# Patient Record
Sex: Female | Born: 1985 | Race: White | Hispanic: No | Marital: Single | Smoking: Never smoker
Health system: Southern US, Community
[De-identification: ages and names within clinical notes are randomized; demographics above are authoritative.]

---

## 2017-05-28 ENCOUNTER — Ambulatory Visit (INDEPENDENT_AMBULATORY_CARE_PROVIDER_SITE_OTHER): Payer: BLUE CROSS/BLUE SHIELD

## 2017-05-28 ENCOUNTER — Encounter: Payer: Self-pay | Admitting: Family

## 2017-05-28 ENCOUNTER — Ambulatory Visit (INDEPENDENT_AMBULATORY_CARE_PROVIDER_SITE_OTHER): Payer: BLUE CROSS/BLUE SHIELD | Admitting: Family

## 2017-05-28 VITALS — BP 100/60 | HR 67 | Temp 98.6°F | Ht 62.0 in | Wt 186.6 lb

## 2017-05-28 DIAGNOSIS — Z30431 Encounter for routine checking of intrauterine contraceptive device: Secondary | ICD-10-CM

## 2017-05-28 DIAGNOSIS — Z0000 Encounter for general adult medical examination without abnormal findings: Secondary | ICD-10-CM | POA: Diagnosis not present

## 2017-05-28 DIAGNOSIS — N898 Other specified noninflammatory disorders of vagina: Secondary | ICD-10-CM

## 2017-05-28 DIAGNOSIS — Z538 Procedure and treatment not carried out for other reasons: Secondary | ICD-10-CM

## 2017-05-28 DIAGNOSIS — E669 Obesity, unspecified: Secondary | ICD-10-CM | POA: Insufficient documentation

## 2017-05-28 DIAGNOSIS — Z01419 Encounter for gynecological examination (general) (routine) without abnormal findings: Secondary | ICD-10-CM

## 2017-05-28 DIAGNOSIS — Z713 Dietary counseling and surveillance: Secondary | ICD-10-CM

## 2017-05-28 DIAGNOSIS — Z975 Presence of (intrauterine) contraceptive device: Secondary | ICD-10-CM

## 2017-05-28 LAB — URINALYSIS, COMPLETE
BILIRUBIN UA: NEGATIVE
GLUCOSE, UA: NEGATIVE
NITRITE UA: NEGATIVE
PH UA: 6 (ref 5.0–7.5)
Specific Gravity, UA: 1.025 (ref 1.005–1.030)
UUROB: 1 mg/dL (ref 0.2–1.0)

## 2017-05-28 LAB — WET PREP FOR TRICH, YEAST, CLUE
Clue Cell Exam: NEGATIVE
TRICHOMONAS EXAM: NEGATIVE
YEAST EXAM: POSITIVE — AB

## 2017-05-28 LAB — MICROSCOPIC EXAMINATION: RENAL EPITHEL UA: NONE SEEN /HPF

## 2017-05-28 LAB — PREGNANCY, URINE: PREG TEST UR: NEGATIVE

## 2017-05-28 MED ORDER — PHENTERMINE HCL 37.5 MG PO TABS: 37.5000 mg | ORAL_TABLET | Freq: Every day | ORAL | 2 refills | Status: AC

## 2017-05-28 NOTE — Progress Notes (Signed)
Subjective:    Patient ID: Margaret Snyder, female    DOB: May 06, 1986, 31 y.o.   MRN: [id number]  Pt presents to the office today to establish care and CPE and pap. PT has Mirena 12/14. PT requesting IUD be removed today. PT states she has gained 20 lbs since IUD. PT has three children and wants to be on OC.  Gynecologic Exam  The patient's primary symptoms include a genital odor and vaginal bleeding (last month, pt has mirena). The patient's pertinent negatives include no genital itching or vaginal discharge. Pertinent negatives include no constipation, diarrhea, flank pain or painful intercourse. She is sexually active.      Review of Systems  Gastrointestinal: Negative for constipation and diarrhea.  Genitourinary: Negative for flank pain and vaginal discharge.  All other systems reviewed and are negative.  Family History  Problem Relation Age of Onset  . Diabetes Father    Social History   Social History  . Marital status: Single    Spouse name: N/A  . Number of children: N/A  . Years of education: N/A   Social History Main Topics  . Smoking status: Never Smoker  . Smokeless tobacco: Never Used  . Alcohol use 1.2 oz/week    2 Glasses of wine per week  . Drug use: No  . Sexual activity: Yes   Other Topics Concern  . None   Social History Narrative  . None         Objective:   Physical Exam  Constitutional: She is oriented to person, place, and time. She appears well-developed and well-nourished. No distress.  HENT:  Head: Normocephalic and atraumatic.  Right Ear: External ear normal.  Left Ear: External ear normal.  Nose: Nose normal.  Mouth/Throat: Oropharynx is clear and moist.  Eyes: Pupils are equal, round, and reactive to light.  Neck: Normal range of motion. Neck supple. No thyromegaly present.  Cardiovascular: Normal rate, regular rhythm, normal heart sounds and intact distal pulses.   No murmur heard. Pulmonary/Chest: Effort normal and breath sounds  normal. No respiratory distress. She has no wheezes. Right breast exhibits no inverted nipple, no mass, no nipple discharge, no skin change and no tenderness. Left breast exhibits no inverted nipple, no mass, no nipple discharge, no skin change and no tenderness. Breasts are symmetrical.  Abdominal: Soft. Bowel sounds are normal. She exhibits no distension. There is no tenderness.  Genitourinary: Vagina normal.  Genitourinary Comments: Bimanual exam- no adnexal masses or tenderness, ovaries nonpalpable   Cervix parous and pink- No discharge, IUD not seen   Musculoskeletal: Normal range of motion. She exhibits no edema or tenderness.  Neurological: She is alert and oriented to person, place, and time. She has normal reflexes. No cranial nerve deficit.  Skin: Skin is warm and dry.  Psychiatric: She has a normal mood and affect. Her behavior is normal. Judgment and thought content normal.  Vitals reviewed.  KUB- IUD seen but inverted   Attempted several times to remove IUD, unsuccessful, pt tolerated well   BP 100/60   Pulse 67   Temp 98.6 F (37 C) (Oral)   Ht 5' 2" (1.575 m)   Wt 186 lb 9.6 oz (84.6 kg)   BMI 34.13 kg/m      Assessment & Plan:  1. Normal gynecologic examination - Urinalysis, Complete - CMP14+EGFR - CBC with Differential/Platelet - Pap IG w/ reflex to HPV when ASC-U  2. Annual physical exam - CMP14+EGFR - CBC with Differential/Platelet - Lipid panel -  Thyroid Panel With TSH - VITAMIN D 25 Hydroxy (Vit-D Deficiency, Fractures) - Pap IG w/ reflex to HPV when ASC-U  3. Vaginal discharge - CMP14+EGFR - CBC with Differential/Platelet - WET PREP FOR TRICH, YEAST, CLUE  4. IUD check up - DG Abd 1 View; Future - Pregnancy, urine  5. Obesity (BMI 30.0-34.9) Pt started on Phentermine today Encourage exercise and diet RTO in 3 months to recheck - Must lose 5% of weight lose to continue - phentermine (ADIPEX-P) 37.5 MG tablet; Take 1 tablet (37.5 mg total)  by mouth daily before breakfast.  Dispense: 30 tablet; Refill: 2  6. Weight loss counseling, encounter for - phentermine (ADIPEX-P) 37.5 MG tablet; Take 1 tablet (37.5 mg total) by mouth daily before breakfast.  Dispense: 30 tablet; Refill: 2  7. Unsuccessful attempt to remove intrauterine device (IUD) -Will send to [facility] - Ambulatory referral to Obstetrics / Gynecology   Continue all meds Labs pending Health Maintenance reviewed Diet and exercise encouraged RTO 3 months to recheck weight loss  Evelina Dun, FNP

## 2017-05-28 NOTE — Patient Instructions (Signed)

## 2017-05-29 LAB — CBC WITH DIFFERENTIAL/PLATELET
BASOS ABS: 0 10*3/uL (ref 0.0–0.2)
Basos: 0 %
EOS (ABSOLUTE): 0 10*3/uL (ref 0.0–0.4)
EOS: 1 %
HEMATOCRIT: 38.2 % (ref 34.0–46.6)
HEMOGLOBIN: 12.6 g/dL (ref 11.1–15.9)
IMMATURE GRANS (ABS): 0 10*3/uL (ref 0.0–0.1)
IMMATURE GRANULOCYTES: 0 %
LYMPHS ABS: 1.8 10*3/uL (ref 0.7–3.1)
LYMPHS: 28 %
MCH: 28.6 pg (ref 26.6–33.0)
MCHC: 33 g/dL (ref 31.5–35.7)
MCV: 87 fL (ref 79–97)
MONOCYTES: 5 %
Monocytes Absolute: 0.4 10*3/uL (ref 0.1–0.9)
NEUTROS PCT: 66 %
Neutrophils Absolute: 4.4 10*3/uL (ref 1.4–7.0)
Platelets: 287 10*3/uL (ref 150–379)
RBC: 4.41 x10E6/uL (ref 3.77–5.28)
RDW: 12.7 % (ref 12.3–15.4)
WBC: 6.7 10*3/uL (ref 3.4–10.8)

## 2017-05-29 LAB — LIPID PANEL
CHOL/HDL RATIO: 3.2 ratio (ref 0.0–4.4)
CHOLESTEROL TOTAL: 142 mg/dL (ref 100–199)
HDL: 45 mg/dL (ref >39)
LDL CALC: 82 mg/dL (ref 0–99)
TRIGLYCERIDES: 77 mg/dL (ref 0–149)
VLDL CHOLESTEROL CAL: 15 mg/dL (ref 5–40)

## 2017-05-29 LAB — CMP14+EGFR
ALBUMIN: 4.3 g/dL (ref 3.5–5.5)
ALK PHOS: 50 IU/L (ref 39–117)
ALT: 11 IU/L (ref 0–32)
AST: 14 IU/L (ref 0–40)
Albumin/Globulin Ratio: 2 (ref 1.2–2.2)
BUN / CREAT RATIO: 13 (ref 9–23)
BUN: 10 mg/dL (ref 6–20)
Bilirubin Total: 0.5 mg/dL (ref 0.0–1.2)
CHLORIDE: 103 mmol/L (ref 96–106)
CO2: 23 mmol/L (ref 20–29)
CREATININE: 0.76 mg/dL (ref 0.57–1.00)
Calcium: 9.2 mg/dL (ref 8.7–10.2)
GFR calc Af Amer: 121 mL/min/{1.73_m2} (ref >59)
GFR calc non Af Amer: 105 mL/min/{1.73_m2} (ref >59)
GLUCOSE: 86 mg/dL (ref 65–99)
Globulin, Total: 2.2 g/dL (ref 1.5–4.5)
Potassium: 4.2 mmol/L (ref 3.5–5.2)
Sodium: 141 mmol/L (ref 134–144)
Total Protein: 6.5 g/dL (ref 6.0–8.5)

## 2017-05-29 LAB — THYROID PANEL WITH TSH
Free Thyroxine Index: 2.5 (ref 1.2–4.9)
T3 Uptake Ratio: 27 % (ref 24–39)
T4 TOTAL: 9.2 ug/dL (ref 4.5–12.0)
TSH: 1.04 u[IU]/mL (ref 0.450–4.500)

## 2017-05-29 LAB — VITAMIN D 25 HYDROXY (VIT D DEFICIENCY, FRACTURES): VIT D 25 HYDROXY: 24.3 ng/mL — AB (ref 30.0–100.0)

## 2017-06-04 LAB — PAP IG W/ RFLX HPV ASCU: PAP Smear Comment: 0

## 2017-06-04 LAB — HPV DNA PROBE HIGH RISK, AMPLIFIED: HPV, high-risk: NEGATIVE

## 2017-06-05 ENCOUNTER — Other Ambulatory Visit: Payer: Self-pay | Admitting: Family

## 2017-06-05 DIAGNOSIS — E559 Vitamin D deficiency, unspecified: Secondary | ICD-10-CM | POA: Insufficient documentation

## 2017-06-05 MED ORDER — FLUCONAZOLE 150 MG PO TABS: 150.0000 mg | ORAL_TABLET | ORAL | 0 refills | Status: AC | PRN

## 2017-06-05 MED ORDER — VITAMIN D (ERGOCALCIFEROL) 1.25 MG (50000 UNIT) PO CAPS: 50000.0000 [IU] | ORAL_CAPSULE | ORAL | 3 refills | Status: AC

## 2017-06-06 ENCOUNTER — Other Ambulatory Visit: Payer: Self-pay | Admitting: Family

## 2017-06-18 ENCOUNTER — Ambulatory Visit (INDEPENDENT_AMBULATORY_CARE_PROVIDER_SITE_OTHER): Payer: BLUE CROSS/BLUE SHIELD | Admitting: Advanced Practice Midwife

## 2017-06-18 ENCOUNTER — Encounter: Payer: Self-pay | Admitting: Advanced Practice Midwife

## 2017-06-18 VITALS — BP 100/60 | HR 78 | Wt 177.4 lb

## 2017-06-18 DIAGNOSIS — Z3202 Encounter for pregnancy test, result negative: Secondary | ICD-10-CM | POA: Diagnosis not present

## 2017-06-18 DIAGNOSIS — Z30432 Encounter for removal of intrauterine contraceptive device: Secondary | ICD-10-CM | POA: Diagnosis not present

## 2017-06-18 LAB — POCT URINE PREGNANCY: PREG TEST UR: NEGATIVE

## 2017-06-18 MED ORDER — ETONOGESTREL-ETHINYL ESTRADIOL 0.12-0.015 MG/24HR VA RING: VAGINAL_RING | VAGINAL | 12 refills | Status: AC

## 2017-06-18 NOTE — Progress Notes (Signed)
  Margaret Snyder 31 y.o.  Vitals:   06/18/17 0846  BP: 100/60  Pulse: 78   No past medical history on file. No past surgical history on file. family history includes Diabetes in her father.  Current Outpatient Prescriptions:  .  levonorgestrel (MIRENA) 20 MCG/24HR IUD, 1 each by Intrauterine route once., Disp: , Rfl:  .  phentermine (ADIPEX-P) 37.5 MG tablet, Take 1 tablet (37.5 mg total) by mouth daily before breakfast., Disp: 30 tablet, Rfl: 2 .  Vitamin D, Ergocalciferol, (DRISDOL) 50000 units CAPS capsule, Take 1 capsule (50,000 Units total) by mouth every 7 (seven) days., Disp: 12 capsule, Rfl: 3 .  fluconazole (DIFLUCAN) 150 MG tablet, Take 1 tablet (150 mg total) by mouth every three (3) days as needed. (Patient not taking: Reported on 06/18/2017), Disp: 3 tablet, Rfl: 0    Here for IUD removal.  She had the Mirena IUD placed 12/14 and would like it removed . Her plans for future contraception are Paediatric nurse (she has used it in the past and loved it).  Her PCP tried to remove it and wasn't able to. The strings weren't visible, so he got an Xray where it was seen (states it was "inverted", but likely d/t uterus being retroflexed).   A graves speculum was placed, and the strings were  Not visible. Using a curved Nicholaus Bloom inserted just inside the cervix, the strings were grasped on the first attempt and the IUD easily removed.  Pt given IUD removal f/u instructions.  Insert NR today, condoms for the first month.

## 2017-06-18 NOTE — Patient Instructions (Signed)
Ethinyl Estradiol; Etonogestrel vaginal ring What is this medicine? ETHINYL ESTRADIOL; ETONOGESTREL (ETH in il es tra DYE ole; et oh noe JES trel) vaginal ring is a flexible, vaginal ring used as a contraceptive (birth control method). This medicine combines two types of female hormones, an estrogen and a progestin. This ring is used to prevent ovulation and pregnancy. Each ring is effective for one month. This medicine may be used for other purposes; ask your health care provider or pharmacist if you have questions. COMMON BRAND NAME(S): NuvaRing What should I tell my health care provider before I take this medicine? They need to know if you have or ever had any of these conditions: -abnormal vaginal bleeding -blood vessel disease or blood clots -breast, cervical, endometrial, ovarian, liver, or uterine cancer -diabetes -gallbladder disease -heart disease or recent heart attack -high blood pressure -high cholesterol -kidney disease -liver disease -migraine headaches -stroke -systemic lupus erythematosus (SLE) -tobacco smoker -an unusual or allergic reaction to estrogens, progestins, other medicines, foods, dyes, or preservatives -pregnant or trying to get pregnant -breast-feeding How should I use this medicine? Insert the ring into your vagina as directed. Follow the directions on the prescription label. The ring will remain place for 3 weeks and is then removed for a 1-week break. A new ring is inserted 1 week after the last ring was removed, on the same day of the week. Check often to make sure the ring is still in place, especially before and after sexual intercourse. If the ring was out of the vagina for an unknown amount of time, you may not be protected from pregnancy. Perform a pregnancy test and call your doctor. Do not use more often than directed. A patient package insert for the product will be given with each prescription and refill. Read this sheet carefully each time. The  sheet may change frequently. Contact your pediatrician regarding the use of this medicine in children. Special care may be needed. This medicine has been used in female children who have started having menstrual periods. Overdosage: If you think you have taken too much of this medicine contact a poison control center or emergency room at once. NOTE: This medicine is only for you. Do not share this medicine with others. What if I miss a dose? You will need to replace your vaginal ring once a month as directed. If the ring should slip out, or if you leave it in longer or shorter than you should, contact your health care professional for advice. What may interact with this medicine? Do not take this medicine with the following medication: -dasabuvir; ombitasvir; paritaprevir; ritonavir -ombitasvir; paritaprevir; ritonavir This medicine may also interact with the following medications: -acetaminophen -antibiotics or medicines for infections, especially rifampin, rifabutin, rifapentine, and griseofulvin, and possibly penicillins or tetracyclines -aprepitant -ascorbic acid (vitamin C) -atorvastatin -barbiturate medicines, such as phenobarbital -bosentan -carbamazepine -caffeine -clofibrate -cyclosporine -dantrolene -doxercalciferol -felbamate -grapefruit juice -hydrocortisone -medicines for anxiety or sleeping problems, such as diazepam or temazepam -medicines for diabetes, including pioglitazone -modafinil -mycophenolate -nefazodone -oxcarbazepine -phenytoin -prednisolone -ritonavir or other medicines for HIV infection or AIDS -rosuvastatin -selegiline -soy isoflavones supplements -St. John's wort -tamoxifen or raloxifene -theophylline -thyroid hormones -topiramate -warfarin This list may not describe all possible interactions. Give your health care provider a list of all the medicines, herbs, non-prescription drugs, or dietary supplements you use. Also tell them if you smoke,  drink alcohol, or use illegal drugs. Some items may interact with your medicine. What should I watch for while using   this medicine? Visit your doctor or health care professional for regular checks on your progress. You will need a regular breast and pelvic exam and Pap smear while on this medicine. Use an additional method of contraception during the first cycle that you use this ring. Do not use a diaphragm or female condom, as the ring can interfere with these birth control methods and their proper placement. If you have any reason to think you are pregnant, stop using this medicine right away and contact your doctor or health care professional. If you are using this medicine for hormone related problems, it may take several cycles of use to see improvement in your condition. Smoking increases the risk of getting a blood clot or having a stroke while you are using hormonal birth control, especially if you are more than 32 years old. You are strongly advised not to smoke. This medicine can make your body retain fluid, making your fingers, hands, or ankles swell. Your blood pressure can go up. Contact your doctor or health care professional if you feel you are retaining fluid. This medicine can make you more sensitive to the sun. Keep out of the sun. If you cannot avoid being in the sun, wear protective clothing and use sunscreen. Do not use sun lamps or tanning beds/booths. If you wear contact lenses and notice visual changes, or if the lenses begin to feel uncomfortable, consult your eye care specialist. In some women, tenderness, swelling, or minor bleeding of the gums may occur. Notify your dentist if this happens. Brushing and flossing your teeth regularly may help limit this. See your dentist regularly and inform your dentist of the medicines you are taking. If you are going to have elective surgery, you may need to stop using this medicine before the surgery. Consult your health care professional  for advice. This medicine does not protect you against HIV infection (AIDS) or any other sexually transmitted diseases. What side effects may I notice from receiving this medicine? Side effects that you should report to your doctor or health care professional as soon as possible: -breast tissue changes or discharge -changes in vaginal bleeding during your period or between your periods -chest pain -coughing up blood -dizziness or fainting spells -headaches or migraines -leg, arm or groin pain -severe or sudden headaches -stomach pain (severe) -sudden shortness of breath -sudden loss of coordination, especially on one side of the body -speech problems -symptoms of vaginal infection like itching, irritation or unusual discharge -tenderness in the upper abdomen -vomiting -weakness or numbness in the arms or legs, especially on one side of the body -yellowing of the eyes or skin Side effects that usually do not require medical attention (report to your doctor or health care professional if they continue or are bothersome): -breakthrough bleeding and spotting that continues beyond the 3 initial cycles of pills -breast tenderness -mood changes, anxiety, depression, frustration, anger, or emotional outbursts -increased sensitivity to sun or ultraviolet light -nausea -skin rash, acne, or brown spots on the skin -weight gain (slight) This list may not describe all possible side effects. Call your doctor for medical advice about side effects. You may report side effects to FDA at 1-800-FDA-1088. Where should I keep my medicine? Keep out of the reach of children. Store at room temperature between 15 and 30 degrees C (59 and 86 degrees F) for up to 4 months. The product will expire after 4 months. Protect from light. Throw away any unused medicine after the expiration date. NOTE: This   sheet is a summary. It may not cover all possible information. If you have questions about this medicine, talk  to your doctor, pharmacist, or health care provider.  2018 [employer]/Gold Standard (2016-06-21 17:00:31)  

## 2017-08-29 ENCOUNTER — Ambulatory Visit: Payer: BLUE CROSS/BLUE SHIELD | Admitting: Family

## 2017-08-30 ENCOUNTER — Encounter: Payer: Self-pay | Admitting: Family

## 2019-02-21 IMAGING — DX DG ABDOMEN 1V
1 series · 1 of 1 positions shown · non-contrast
Comparison: None.

CLINICAL DATA: Evaluate intrauterine device

EXAM:
ABDOMEN - 1 VIEW

[abdomen kub]
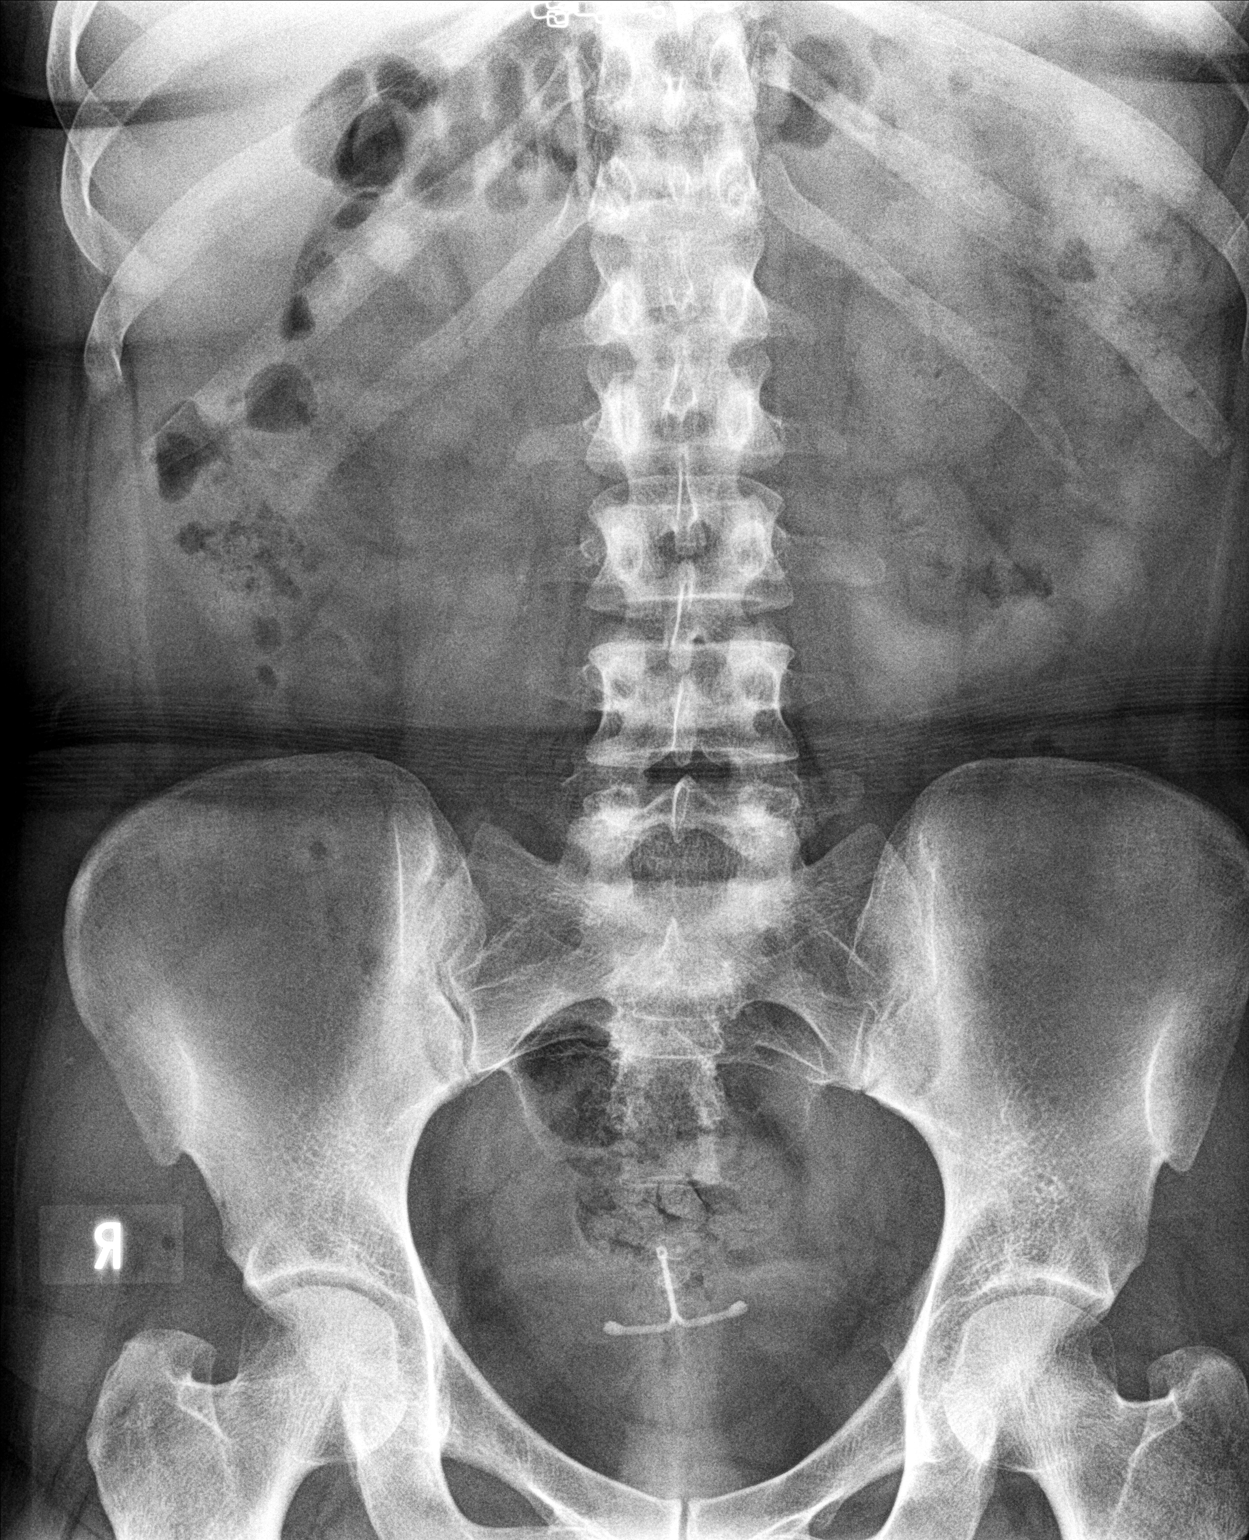

[1 of 1 positions shown; findings below may reference images not displayed]

FINDINGS: An intrauterine device overlies the midline of the lower pelvis.

Paucity of bowel gas without evidence of enteric obstruction.

No acute osseus abnormalities.
IMPRESSION: An intrauterine device overlies the midline of the lower pelvis.
